# Patient Record
Sex: Female | Born: 1965 | Race: White | Hispanic: No | Marital: Single | Smoking: Never smoker
Health system: Southern US, Community
[De-identification: ages and names within clinical notes are randomized; demographics above are authoritative.]

## PROBLEM LIST (undated history)

## (undated) HISTORY — PX: ABDOMINAL HYSTERECTOMY: SHX81

---

## 2002-07-28 ENCOUNTER — Encounter: Payer: Self-pay | Admitting: Emergency Medicine

## 2002-07-28 ENCOUNTER — Emergency Department (HOSPITAL_COMMUNITY): Admission: EM | Admit: 2002-07-28 | Discharge: 2002-07-28 | Payer: Self-pay | Admitting: Emergency Medicine

## 2011-07-09 ENCOUNTER — Ambulatory Visit: Payer: Self-pay | Admitting: Internal Medicine

## 2016-02-06 ENCOUNTER — Emergency Department
Admission: EM | Admit: 2016-02-06 | Discharge: 2016-02-06 | Disposition: A | Discharge status: Home / Self Care | Payer: Managed Care, Other (non HMO) | Attending: Emergency Medicine | Admitting: Emergency Medicine

## 2016-02-06 ENCOUNTER — Encounter: Payer: Self-pay | Admitting: Emergency Medicine

## 2016-02-06 ENCOUNTER — Emergency Department: Payer: Managed Care, Other (non HMO)

## 2016-02-06 DIAGNOSIS — W19XXXA Unspecified fall, initial encounter: Secondary | ICD-10-CM

## 2016-02-06 DIAGNOSIS — S82045A Nondisplaced comminuted fracture of left patella, initial encounter for closed fracture: Secondary | ICD-10-CM | POA: Insufficient documentation

## 2016-02-06 DIAGNOSIS — Y939 Activity, unspecified: Secondary | ICD-10-CM | POA: Diagnosis not present

## 2016-02-06 DIAGNOSIS — W010XXA Fall on same level from slipping, tripping and stumbling without subsequent striking against object, initial encounter: Secondary | ICD-10-CM | POA: Insufficient documentation

## 2016-02-06 DIAGNOSIS — Y929 Unspecified place or not applicable: Secondary | ICD-10-CM | POA: Insufficient documentation

## 2016-02-06 DIAGNOSIS — Y999 Unspecified external cause status: Secondary | ICD-10-CM | POA: Diagnosis not present

## 2016-02-06 DIAGNOSIS — S63501A Unspecified sprain of right wrist, initial encounter: Secondary | ICD-10-CM | POA: Diagnosis not present

## 2016-02-06 DIAGNOSIS — S8992XA Unspecified injury of left lower leg, initial encounter: Secondary | ICD-10-CM | POA: Diagnosis present

## 2016-02-06 MED ORDER — IBUPROFEN 800 MG PO TABS: 800.0000 mg | ORAL_TABLET | Freq: Three times a day (TID) | ORAL | 0 refills | Status: DC | PRN

## 2016-02-06 MED ORDER — HYDROCODONE-ACETAMINOPHEN 5-325 MG PO TABS
1.0000 | ORAL_TABLET | Freq: Four times a day (QID) | ORAL | 0 refills | Status: DC | PRN
Start: 2016-02-06 — End: 2020-04-30

## 2016-02-06 NOTE — ED Notes (Signed)
See triage note  States she slipped on ice  Having pain to left knee and right wrist area  No deformity noted

## 2016-02-06 NOTE — ED Provider Notes (Signed)
[facility] Emergency Department Provider Note  ____________________________________________  Time seen: Approximately 2:21 PM  I have reviewed the triage vital signs and the nursing notes.   HISTORY  Chief Complaint Fall    HPI Jacqueline Short is a 51 y.o. female, NAD, presents to the Emergency Department after a fall on the ice this morning. Patient states she fell forward, landing on her left knee and an outstretched right hand. They said pain about her wrist is minimal and the pain about her knee is significant. States the pain about the knee increases with weightbearing or simple movements. Denies any numbness, weakness, tingling about the upper or lower extremities. Has not noted any open wounds or lacerations. Has noted some mild swelling about the left knee but no bruising, redness or abnormal warmth at this time. Has not taken anything for pain at this time and verbalizes that she does not want anything for pain at this time. States she had no head injuries, headaches, LOC. States the fall was mechanical in nature.   History reviewed. No pertinent past medical history.  There are no active problems to display for this patient.   Past Surgical History:  Procedure Laterality Date  . ABDOMINAL HYSTERECTOMY      Prior to Admission medications   Medication Sig Start Date End Date Taking? Authorizing Provider  HYDROcodone-acetaminophen (NORCO) 5-325 MG tablet Take 1 tablet by mouth every 6 (six) hours as needed for severe pain. 02/06/16   Pricsilla Lindvall L Jaci Desanto, PA-C  ibuprofen (ADVIL,MOTRIN) 800 MG tablet Take 1 tablet (800 mg total) by mouth every 8 (eight) hours as needed (pain). 02/06/16   [place] Winslow L Dickie Labarre, PA-C    Allergies Patient has no known allergies.  History reviewed. No pertinent family history.  Social History Social History  Substance Use Topics  . Smoking status: Never Smoker  . Smokeless tobacco: Never Used  . Alcohol use No     Review of  Systems Constitutional: No Fatigue Eyes: No visual changes. Musculoskeletal: Positive for right wrist pain, left knee pain. No back or neck pain.  Skin: Negative for rash or redness, swelling, abnormal warmth, bruising, open wounds or lacerations. Neurological: Negative for numbness, weakness, tingling. No LOC or head injury.   ____________________________________________   PHYSICAL EXAM:  VITAL SIGNS: ED Triage Vitals  Enc Vitals Group     BP 02/06/16 1238 109/63     Pulse Rate 02/06/16 1238 79     Resp --      Temp 02/06/16 1238 98.8 F (37.1 C)     Temp Source 02/06/16 1238 Oral     SpO2 02/06/16 1238 97 %     Weight 02/06/16 1239 168 lb (76.2 kg)     Height 02/06/16 1239 5\' 4"  (1.626 m)     Head Circumference --      Peak Flow --      Pain Score 02/06/16 1239 3     Pain Loc --      Pain Edu? --      Excl. in GC? --      Constitutional: Alert and oriented. Well appearing and in no acute distress. Eyes: Conjunctivae are normal. Head: Atraumatic. Cardiovascular: Good peripheral circulation with 2+ pulses noted in the right upper extremity and left lower extremity. Capillary refill is brisk in all digits of the right hand and left foot. Respiratory: Normal respiratory effort without tachypnea or retractions.  Musculoskeletal: Tenderness to palpation about the right distal forearm without bony abnormality or crepitus.  No obvious deformities. Full range of motion of the right wrist, hand and fingers without pain or difficulty. Patient is able pronate and supinate the right forearm without difficulty or pain. Grip strength is 5 out of 5 in bilateral upper extremities and is equal. Significant tenderness to palpation of the anterior left knee. No bony abnormality or crepitus is noted. Decreased range of motion due to severe pain about the left knee. Neurologic:  Normal speech and language. No gross focal neurologic deficits are appreciated. Sensation light touch grossly intact  about bilateral upper and lower extremities. Skin:  Moderate swelling about the left knee is noted without fluctuance. No wounds or lacerations. Skin is warm, dry and intact. No rash noted. Psychiatric: Mood and affect are normal. Speech and behavior are normal. Patient exhibits appropriate insight and judgement.   ____________________________________________   LABS  None ____________________________________________  EKG  None ____________________________________________  RADIOLOGY I, Hope PigeonJami L Prudy Candy, personally viewed and evaluated these images (plain radiographs) as part of my medical decision making, as well as reviewing the written report by the radiologist.  Dg Wrist Complete Right  Result Date: 02/06/2016 CLINICAL DATA:  Pain after fall EXAM: RIGHT WRIST - COMPLETE 3+ VIEW COMPARISON:  None. FINDINGS: There is no evidence of fracture or dislocation. There is no evidence of arthropathy or other focal bone abnormality. Soft tissues are unremarkable. IMPRESSION: Negative. Electronically Signed   By: Tollie Ethavid  Kwon M.D.   On: 02/06/2016 13:37   Dg Knee Complete 4 Views Left  Result Date: 02/06/2016 CLINICAL DATA:  Anterior knee pain after fall today, initial encounter. EXAM: LEFT KNEE - COMPLETE 4+ VIEW COMPARISON:  None. FINDINGS: Acute, closed, comminuted patellar fracture without distraction with dominant transverse fracture line seen through the mid patella and smaller component seen anteriorly along the lower pole. Moderate suprapatellar joint effusion without fat fluid level. Mild prepatellar soft tissue swelling. The femoral condyles and visualized tibia and fibula appear intact. IMPRESSION: Acute, closed, comminuted appearing left patellar fracture without distraction. Suprapatellar joint effusion. Electronically Signed   By: Tollie Ethavid  Kwon M.D.   On: 02/06/2016 13:36    ____________________________________________    PROCEDURES  Procedure(s) performed:  None   Procedures   Medications - No data to display   ____________________________________________   INITIAL IMPRESSION / ASSESSMENT AND PLAN / ED COURSE  Pertinent labs & imaging results that were available during my care of the patient were reviewed by me and considered in my medical decision making (see chart for details).    Patient's diagnosis is consistent with a closed nondisplaced comminuted fracture of the left patella, right wrist sprain due to fall. I spoke with Dr. Ernest PineHooten and consult of the patient's left knee fracture. He reviewed images and agreed that the patient may be placed in an immobilizer and discharged home with follow-up in his office next week. Patient's left knee was placed in a knee immobilizer she was given crutches for supportive care. Also placed in a right cock up wrist splint for supportive care. Patient will be discharged home with prescriptions for Norco to take as needed for severe pain. Patient is to follow up with Dr. Ernest PineHooten in orthopedics in 3 days for further evaluation and treatment of fracture. Patient is given ED precautions to return to the ED for any worsening or new symptoms.   ____________________________________________  FINAL CLINICAL IMPRESSION(S) / ED DIAGNOSES  Final diagnoses:  Closed nondisplaced comminuted fracture of left patella, initial encounter  Right wrist sprain, initial encounter  Fall, initial  encounter      NEW MEDICATIONS STARTED DURING THIS VISIT:  Discharge Medication List as of 02/06/2016  2:24 PM    START taking these medications   Details  HYDROcodone-acetaminophen (NORCO) 5-325 MG tablet Take 1 tablet by mouth every 6 (six) hours as needed for severe pain., Starting Fri 02/06/2016, Print    ibuprofen (ADVIL,MOTRIN) 800 MG tablet Take 1 tablet (800 mg total) by mouth every 8 (eight) hours as needed (pain)., Starting Fri 02/06/2016, Print             Ernestene Kiel [place], PA-C 02/06/16 1900    Governor Rooks,  MD 02/06/16 2043

## 2016-02-06 NOTE — ED Triage Notes (Signed)
Pt slipped on ice and fell on left knee and right wrist. Left knee swollen and painful, was unable to bare weight and stand at scene. States she is unable to straighten knee. Right wrist painful but no deformity, full rom.

## 2016-02-06 NOTE — Discharge Instructions (Signed)
Rest. Apply ice to the affected knee multiple times daily. No weightbearing about the left lower extremity. Please use crutches at all times.

## 2017-08-01 IMAGING — CR DG WRIST COMPLETE 3+V*R*
1 series · 4 of 4 positions shown · non-contrast
Comparison: None.

CLINICAL DATA: Pain after fall

EXAM:
RIGHT WRIST - COMPLETE 3+ VIEW

[Series 1: x wrist pa right · 0.14mm/px · 4 of 4 slices shown]
[im 1/4]
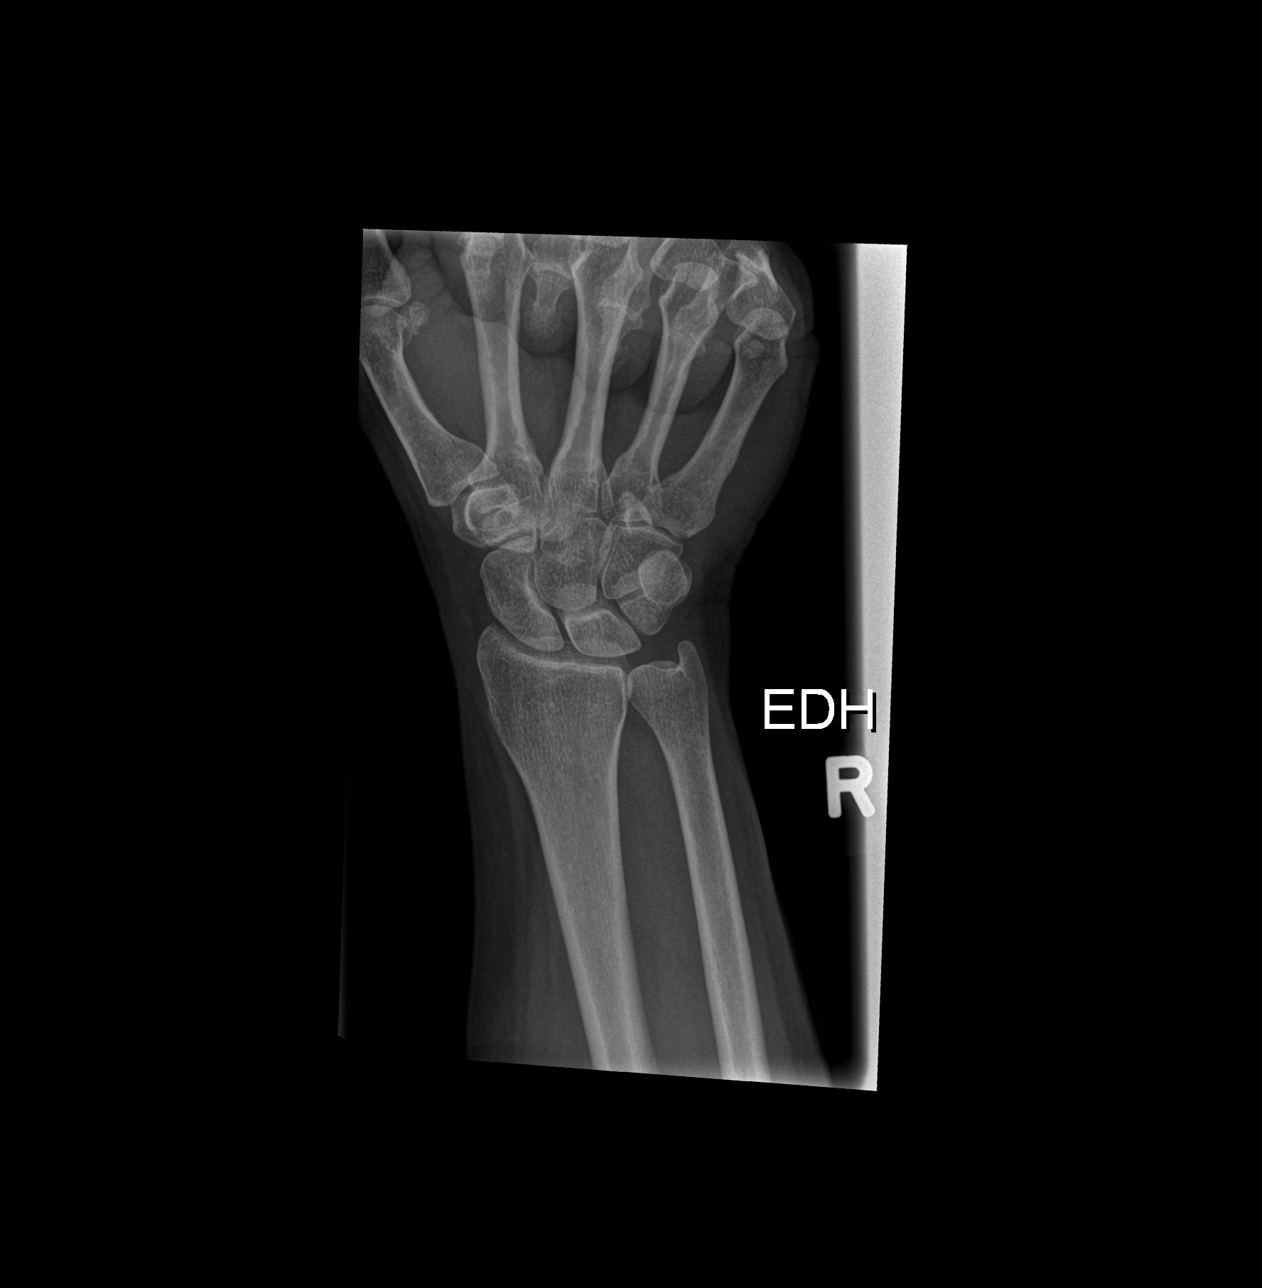
[im 2/4]
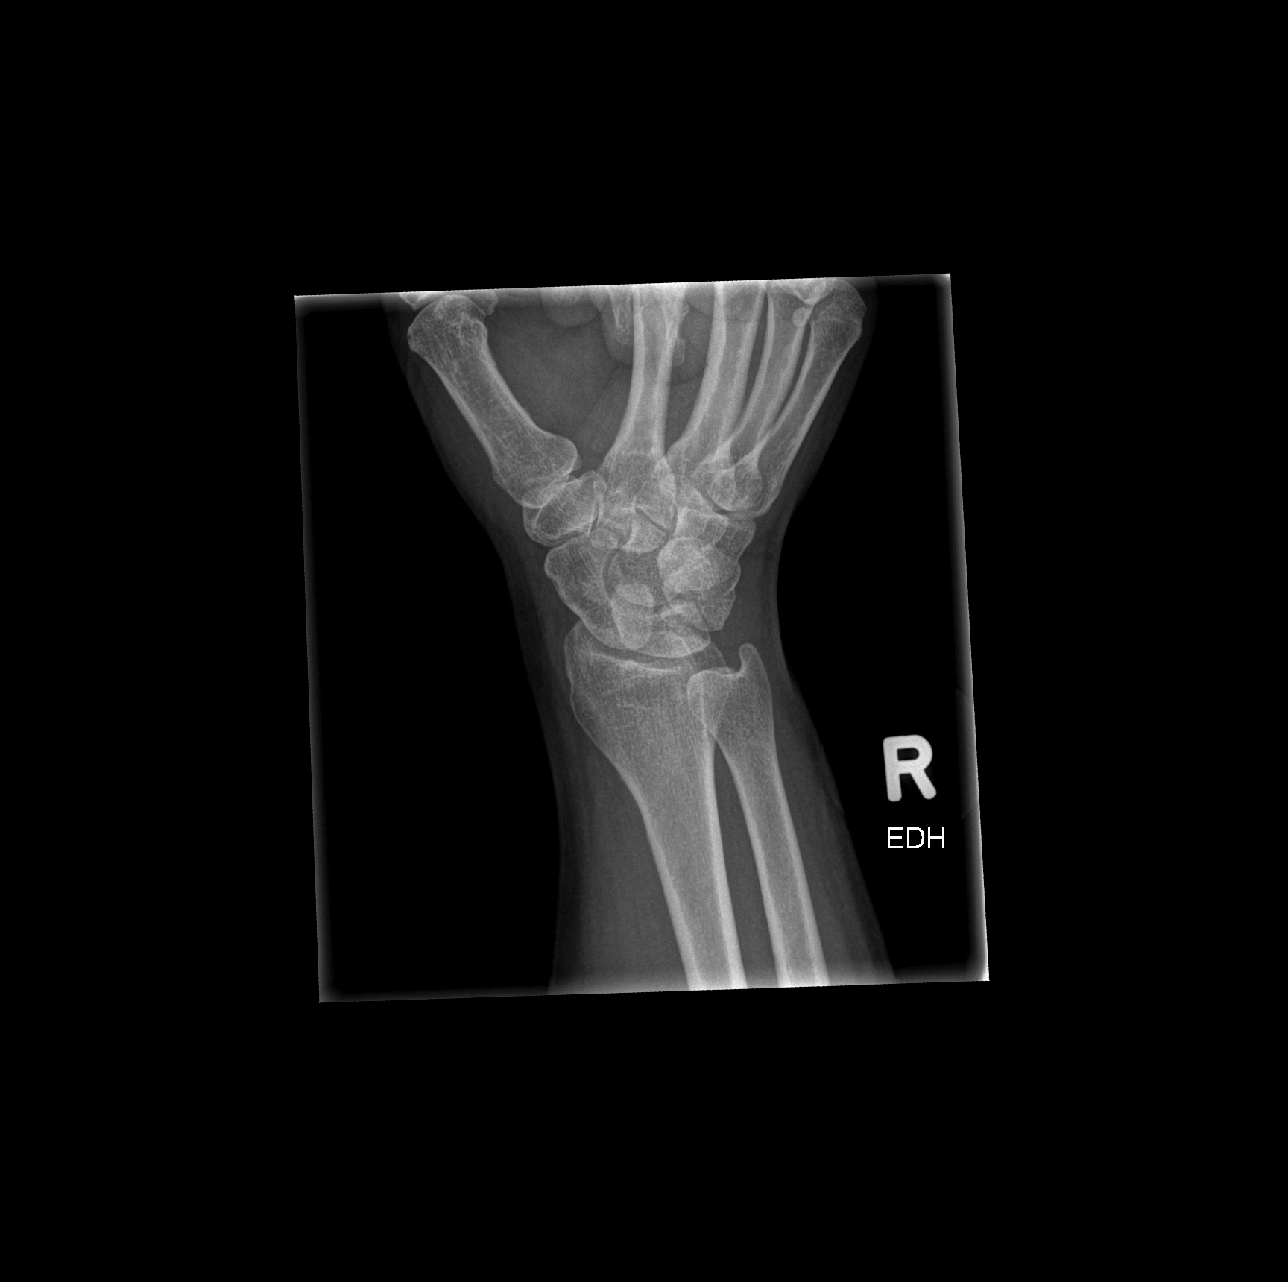
[im 3/4]
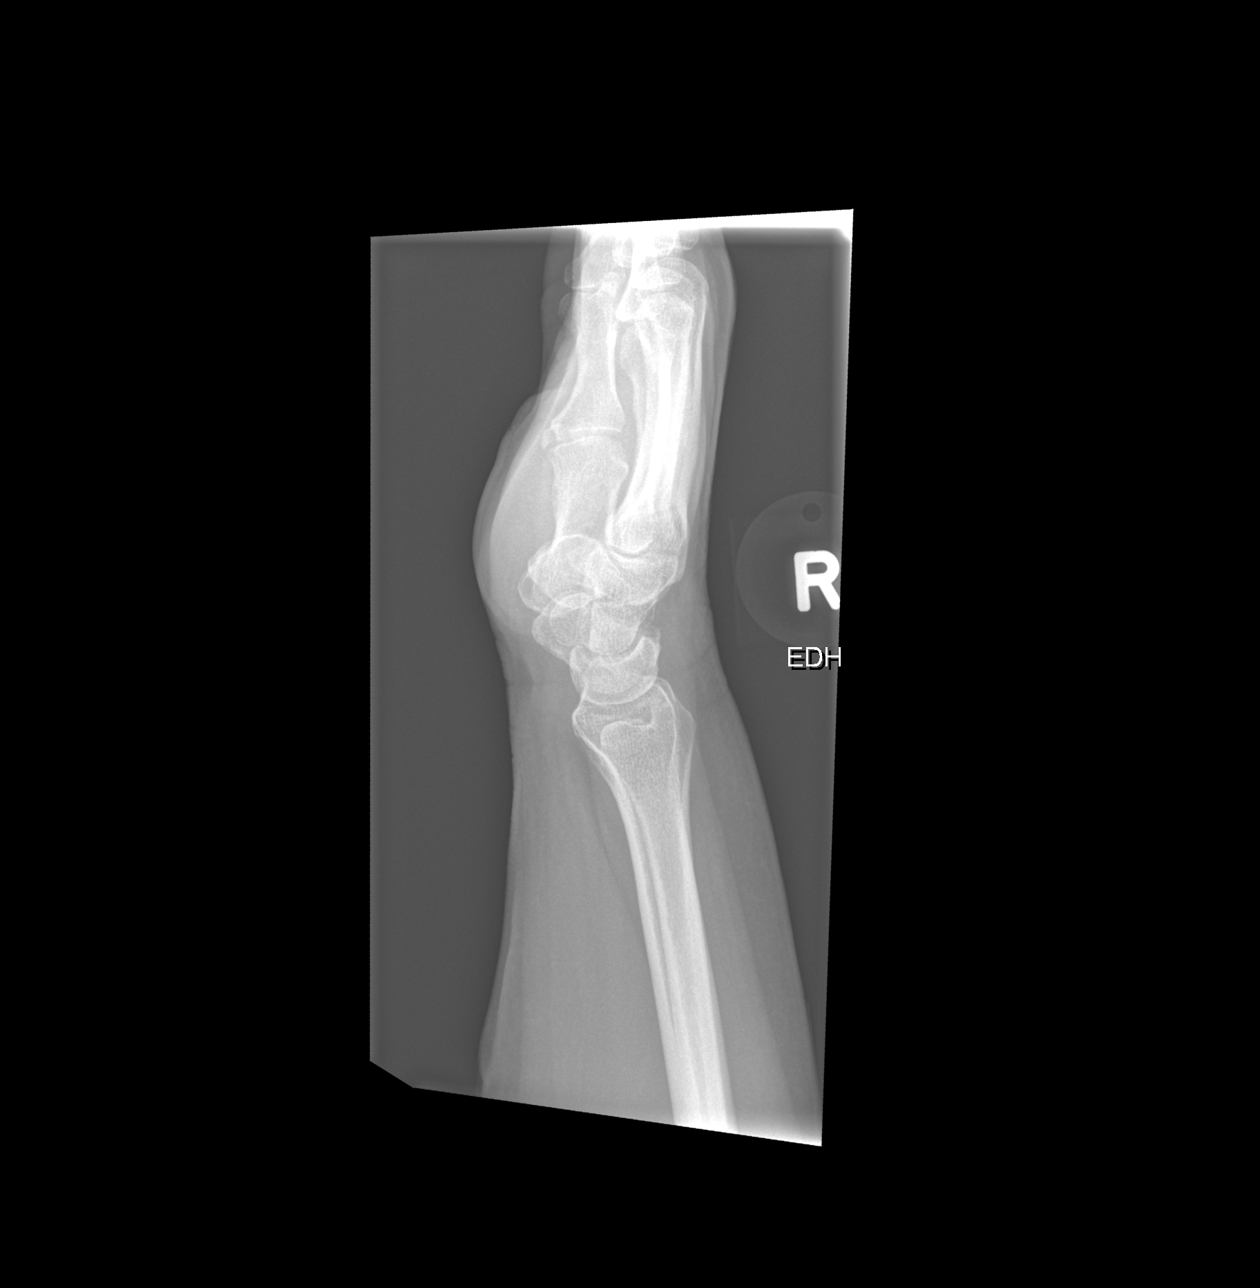
[im 4/4]
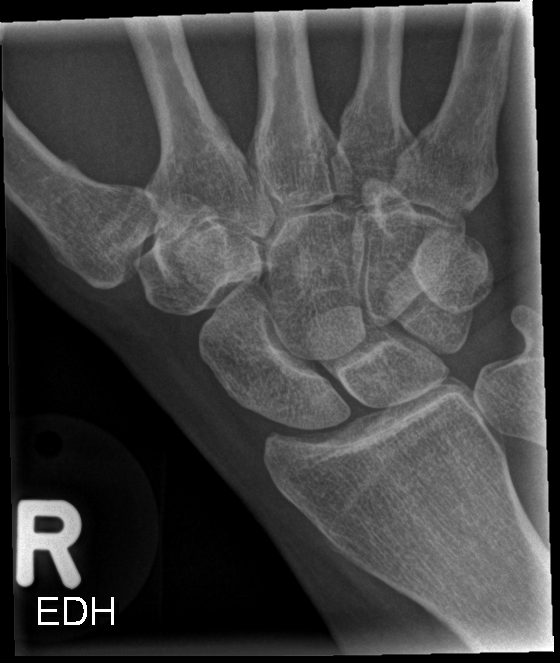

[4 of 4 positions shown; findings below may reference images not displayed]

FINDINGS: There is no evidence of fracture or dislocation. There is no
evidence of arthropathy or other focal bone abnormality. Soft
tissues are unremarkable.
IMPRESSION: Negative.

## 2018-12-08 ENCOUNTER — Other Ambulatory Visit: Payer: Self-pay

## 2018-12-08 DIAGNOSIS — Z20822 Contact with and (suspected) exposure to covid-19: Secondary | ICD-10-CM

## 2018-12-11 LAB — NOVEL CORONAVIRUS, NAA: SARS-CoV-2, NAA: NOT DETECTED

## 2020-04-30 ENCOUNTER — Other Ambulatory Visit: Payer: Self-pay

## 2020-04-30 ENCOUNTER — Ambulatory Visit (INDEPENDENT_AMBULATORY_CARE_PROVIDER_SITE_OTHER): Payer: Self-pay | Admitting: Plastic Surgery

## 2020-04-30 ENCOUNTER — Encounter: Payer: Self-pay | Admitting: Plastic Surgery

## 2020-04-30 VITALS — BP 117/79 | HR 97 | Ht 64.0 in | Wt 206.2 lb

## 2020-04-30 DIAGNOSIS — Z411 Encounter for cosmetic surgery: Secondary | ICD-10-CM

## 2020-04-30 NOTE — Progress Notes (Signed)
   Referring Provider No referring provider defined for this encounter.   CC:  Chief Complaint  Patient presents with  . Advice Only      Jacqueline Short is an 55 y.o. female.  HPI: Patient presents to discuss aesthetic concerns with her face.  She is bothered by the lower face and neck areas and feels like the past couple years have been pretty stressful for her because those areas to age more than she would like.  She is also bothered by the excess upper eyelid skin.  She wants to see if anything can be done surgically.  She has not had any surgical procedures in the past but has had some nonsurgical facial rejuvenation quite some time ago.  No Known Allergies  Outpatient Encounter Medications as of 04/30/2020  Medication Sig  . [DISCONTINUED] HYDROcodone-acetaminophen (NORCO) 5-325 MG tablet Take 1 tablet by mouth every 6 (six) hours as needed for severe pain.  . [DISCONTINUED] ibuprofen (ADVIL,MOTRIN) 800 MG tablet Take 1 tablet (800 mg total) by mouth every 8 (eight) hours as needed (pain).   No facility-administered encounter medications on file as of 04/30/2020.     No past medical history on file.  Past Surgical History:  Procedure Laterality Date  . ABDOMINAL HYSTERECTOMY      No family history on file.  Social History   Social History Narrative  . Not on file  Denies tobacco use  Review of Systems General: Denies fevers, chills, weight loss CV: Denies chest pain, shortness of breath, palpitations  Physical Exam Vitals with BMI 04/30/2020 02/06/2016  Height 5\' 4"  5\' 4"   Weight 206 lbs 3 oz 168 lbs  BMI 35.38 28.9  Systolic 117 109  Diastolic 79 63  Pulse 97 79    General:  No acute distress,  Alert and oriented, Non-Toxic, Normal speech and affect HEENT: She has mild to moderate [place] area as well as central neck skin laxity and adipose tissue.  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  She has significant dermatochalasis of both upper eyelids.  The  upper eyelid margins are symmetric with good levator function.  Eyebrows are drawn or tattooed on and they are in a good position.  Cranial nerve function seems to be intact with no obvious asymmetries or irregularities in facial nerve function.  No obvious scars in the areas of concern.  Assessment/Plan Patient is a good candidate for surgical treatment for her aesthetic concerns.  For the jowl and neck I would recommend a full facelift with neck liposuction and SMAS plication.  For the upper eyelids we discussed upper eyelid blepharoplasty.  We discussed the risks and benefits of these procedures that include bleeding, infection, damage to surrounding structures and need for additional procedures.  We discussed the potential for facial nerve injury and bleeding postoperatively and what I would do to avoid those complications.  I explained the location and orientation of the scars in detail along with the procedure and the expected postoperative downtime which I quoted is about 3 to 4 weeks.  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  All her questions were answered and we will plan to move ahead.  04/30/2020, 11:03 AM

## 2020-06-04 ENCOUNTER — Encounter: Payer: Self-pay | Admitting: Plastic Surgery
